# Patient Record
Sex: Male | Born: 1955 | Race: White | Hispanic: No | Marital: Single
Health system: Southern US, Community
[De-identification: ages and names within clinical notes are randomized; demographics above are authoritative.]

---

## 2011-10-29 ENCOUNTER — Emergency Department: Payer: Self-pay | Admitting: Emergency Medicine

## 2011-10-29 LAB — COMPREHENSIVE METABOLIC PANEL
Anion Gap: 9 (ref 7–16)
BUN: 5 mg/dL — ABNORMAL LOW (ref 7–18)
Calcium, Total: 8.6 mg/dL (ref 8.5–10.1)
Chloride: 96 mmol/L — ABNORMAL LOW (ref 98–107)
Co2: 26 mmol/L (ref 21–32)
Creatinine: 0.88 mg/dL (ref 0.60–1.30)
EGFR (African American): >60
EGFR (Non-African Amer.): >60
Osmolality: 264 (ref 275–301)
Potassium: 3.5 mmol/L (ref 3.5–5.1)
SGOT(AST): 23 U/L (ref 15–37)
SGPT (ALT): 15 U/L (ref 12–78)
Total Protein: 7.6 g/dL (ref 6.4–8.2)

## 2011-10-29 LAB — URINALYSIS, COMPLETE
Bacteria: NONE SEEN
Glucose,UR: 150 mg/dL (ref 0–75)
Ketone: NEGATIVE
Nitrite: NEGATIVE
Protein: 100
RBC,UR: 1 /HPF (ref 0–5)
Squamous Epithelial: NONE SEEN
WBC UR: 1 /HPF (ref 0–5)

## 2011-10-29 LAB — CK TOTAL AND CKMB (NOT AT ARMC): CK-MB: 3 ng/mL (ref 0.5–3.6)

## 2011-10-29 LAB — PROTIME-INR
INR: 0.9
Prothrombin Time: 12.9 secs (ref 11.5–14.7)

## 2011-10-29 LAB — PHOSPHORUS: Phosphorus: 2.4 mg/dL — ABNORMAL LOW (ref 2.5–4.9)

## 2011-10-29 LAB — CBC
HCT: 36.1 % — ABNORMAL LOW (ref 40.0–52.0)
HGB: 12.6 g/dL — ABNORMAL LOW (ref 13.0–18.0)
MCH: 30.4 pg (ref 26.0–34.0)
MCHC: 35 g/dL (ref 32.0–36.0)
MCV: 87 fL (ref 80–100)
RDW: 15.3 % — ABNORMAL HIGH (ref 11.5–14.5)

## 2011-10-29 LAB — MAGNESIUM: Magnesium: 1.1 mg/dL — ABNORMAL LOW

## 2012-01-12 ENCOUNTER — Emergency Department: Payer: Self-pay | Admitting: Internal Medicine

## 2012-01-12 ENCOUNTER — Emergency Department: Payer: Self-pay | Admitting: Emergency Medicine

## 2012-01-12 LAB — CBC
HCT: 36.1 % — ABNORMAL LOW (ref 40.0–52.0)
HGB: 12.2 g/dL — ABNORMAL LOW (ref 13.0–18.0)
MCH: 28.8 pg (ref 26.0–34.0)
Platelet: 259 10*3/uL (ref 150–440)
RDW: 14.7 % — ABNORMAL HIGH (ref 11.5–14.5)
WBC: 10.8 10*3/uL — ABNORMAL HIGH (ref 3.8–10.6)

## 2012-01-12 LAB — COMPREHENSIVE METABOLIC PANEL
Alkaline Phosphatase: 92 U/L (ref 50–136)
BUN: 9 mg/dL (ref 7–18)
Bilirubin,Total: 0.7 mg/dL (ref 0.2–1.0)
Calcium, Total: 8.8 mg/dL (ref 8.5–10.1)
Chloride: 90 mmol/L — ABNORMAL LOW (ref 98–107)
Co2: 21 mmol/L (ref 21–32)
Creatinine: 1.06 mg/dL (ref 0.60–1.30)
EGFR (African American): >60
EGFR (Non-African Amer.): >60
Glucose: 228 mg/dL — ABNORMAL HIGH (ref 65–99)
SGOT(AST): 24 U/L (ref 15–37)
SGPT (ALT): 20 U/L (ref 12–78)

## 2012-01-12 LAB — TROPONIN I
Troponin-I: <0.02 ng/mL
Troponin-I: <0.02 ng/mL

## 2012-03-17 ENCOUNTER — Inpatient Hospital Stay: Payer: Self-pay | Admitting: Student

## 2012-03-17 LAB — COMPREHENSIVE METABOLIC PANEL
Albumin: 4.1 g/dL (ref 3.4–5.0)
Alkaline Phosphatase: 100 U/L (ref 50–136)
Anion Gap: 15 (ref 7–16)
Bilirubin,Total: 0.8 mg/dL (ref 0.2–1.0)
Calcium, Total: 9 mg/dL (ref 8.5–10.1)
Chloride: 84 mmol/L — ABNORMAL LOW (ref 98–107)
EGFR (African American): 44 — ABNORMAL LOW
EGFR (Non-African Amer.): 38 — ABNORMAL LOW
Glucose: 343 mg/dL — ABNORMAL HIGH (ref 65–99)
Potassium: 4.6 mmol/L (ref 3.5–5.1)
SGPT (ALT): 24 U/L (ref 12–78)
Sodium: 119 mmol/L — CL (ref 136–145)
Total Protein: 8 g/dL (ref 6.4–8.2)

## 2012-03-17 LAB — CK TOTAL AND CKMB (NOT AT ARMC): CK-MB: 2.7 ng/mL (ref 0.5–3.6)

## 2012-03-17 LAB — CBC
HGB: 13.7 g/dL (ref 13.0–18.0)
MCH: 28.7 pg (ref 26.0–34.0)
MCHC: 34.1 g/dL (ref 32.0–36.0)
MCV: 84 fL (ref 80–100)
RBC: 4.75 10*6/uL (ref 4.40–5.90)
WBC: 12.7 10*3/uL — ABNORMAL HIGH (ref 3.8–10.6)

## 2012-03-17 LAB — URINALYSIS, COMPLETE
Bilirubin,UR: NEGATIVE
Glucose,UR: >=500 mg/dL (ref 0–75)
Leukocyte Esterase: NEGATIVE
Ph: 5 (ref 4.5–8.0)
WBC UR: <1 /HPF (ref 0–5)

## 2012-03-17 LAB — BASIC METABOLIC PANEL
Anion Gap: 13 (ref 7–16)
BUN: 32 mg/dL — ABNORMAL HIGH (ref 7–18)
Calcium, Total: 8.8 mg/dL (ref 8.5–10.1)
EGFR (African American): 43 — ABNORMAL LOW
Glucose: 259 mg/dL — ABNORMAL HIGH (ref 65–99)
Osmolality: 267 (ref 275–301)
Sodium: 125 mmol/L — ABNORMAL LOW (ref 136–145)

## 2012-03-17 LAB — OSMOLALITY: Osmolality: 277 mOsm/kg (ref 275–295)

## 2012-03-17 LAB — TROPONIN I: Troponin-I: 0.03 ng/mL

## 2012-03-18 LAB — CBC WITH DIFFERENTIAL/PLATELET
Eosinophil #: 0.3 10*3/uL (ref 0.0–0.7)
MCH: 28.8 pg (ref 26.0–34.0)
MCHC: 34.9 g/dL (ref 32.0–36.0)
Monocyte #: 0.7 x10 3/mm (ref 0.2–1.0)
Monocyte %: 7.3 %
Neutrophil %: 58.2 %
RDW: 14.4 % (ref 11.5–14.5)
WBC: 9 10*3/uL (ref 3.8–10.6)

## 2012-03-18 LAB — BASIC METABOLIC PANEL
Calcium, Total: 8.4 mg/dL — ABNORMAL LOW (ref 8.5–10.1)
Chloride: 94 mmol/L — ABNORMAL LOW (ref 98–107)
Creatinine: 1.41 mg/dL — ABNORMAL HIGH (ref 0.60–1.30)
EGFR (Non-African Amer.): 56 — ABNORMAL LOW
Glucose: 196 mg/dL — ABNORMAL HIGH (ref 65–99)
Osmolality: 266 (ref 275–301)
Potassium: 3.6 mmol/L (ref 3.5–5.1)

## 2012-03-18 LAB — LIPID PANEL
Ldl Cholesterol, Calc: 53 mg/dL (ref 0–100)
Triglycerides: 218 mg/dL — ABNORMAL HIGH (ref 0–200)
VLDL Cholesterol, Calc: 44 mg/dL — ABNORMAL HIGH (ref 5–40)

## 2012-03-18 LAB — MAGNESIUM: Magnesium: 2.1 mg/dL

## 2012-03-19 LAB — BASIC METABOLIC PANEL
BUN: 24 mg/dL — ABNORMAL HIGH (ref 7–18)
Creatinine: 1.48 mg/dL — ABNORMAL HIGH (ref 0.60–1.30)
EGFR (African American): >60
EGFR (Non-African Amer.): 53 — ABNORMAL LOW
Glucose: 307 mg/dL — ABNORMAL HIGH (ref 65–99)
Osmolality: 282 (ref 275–301)
Potassium: 4.1 mmol/L (ref 3.5–5.1)
Sodium: 133 mmol/L — ABNORMAL LOW (ref 136–145)

## 2012-03-19 LAB — HEMOGLOBIN A1C

## 2012-03-20 LAB — BASIC METABOLIC PANEL
Anion Gap: 7 (ref 7–16)
BUN: 19 mg/dL — ABNORMAL HIGH (ref 7–18)
Calcium, Total: 8.9 mg/dL (ref 8.5–10.1)
EGFR (African American): >60
EGFR (Non-African Amer.): 53 — ABNORMAL LOW
Glucose: 355 mg/dL — ABNORMAL HIGH (ref 65–99)
Osmolality: 285 (ref 275–301)

## 2012-03-21 LAB — BASIC METABOLIC PANEL
Anion Gap: 7 (ref 7–16)
Calcium, Total: 9 mg/dL (ref 8.5–10.1)
Co2: 26 mmol/L (ref 21–32)
EGFR (African American): 57 — ABNORMAL LOW
Osmolality: 270 (ref 275–301)
Potassium: 4.6 mmol/L (ref 3.5–5.1)
Sodium: 130 mmol/L — ABNORMAL LOW (ref 136–145)

## 2012-03-22 ENCOUNTER — Emergency Department: Payer: Self-pay | Admitting: Emergency Medicine

## 2012-03-22 LAB — CBC
MCH: 28.7 pg (ref 26.0–34.0)
MCV: 87 fL (ref 80–100)
RBC: 3.97 10*6/uL — ABNORMAL LOW (ref 4.40–5.90)
RDW: 14.6 % — ABNORMAL HIGH (ref 11.5–14.5)

## 2012-03-22 LAB — URINALYSIS, COMPLETE
Blood: NEGATIVE
Glucose,UR: >=500 mg/dL (ref 0–75)
Ketone: NEGATIVE
Leukocyte Esterase: NEGATIVE
Nitrite: NEGATIVE
Protein: NEGATIVE
RBC,UR: <1 /HPF (ref 0–5)
WBC UR: <1 /HPF (ref 0–5)

## 2012-03-22 LAB — COMPREHENSIVE METABOLIC PANEL
BUN: 18 mg/dL (ref 7–18)
Chloride: 103 mmol/L (ref 98–107)
Co2: 26 mmol/L (ref 21–32)
Creatinine: 1.55 mg/dL — ABNORMAL HIGH (ref 0.60–1.30)
EGFR (African American): 58 — ABNORMAL LOW
Glucose: 199 mg/dL — ABNORMAL HIGH (ref 65–99)
Potassium: 5 mmol/L (ref 3.5–5.1)
SGOT(AST): 26 U/L (ref 15–37)
Total Protein: 6.6 g/dL (ref 6.4–8.2)

## 2012-09-22 ENCOUNTER — Inpatient Hospital Stay: Payer: Self-pay | Admitting: Family Medicine

## 2012-09-22 LAB — URINALYSIS, COMPLETE
Bacteria: NONE SEEN
Blood: NEGATIVE
Glucose,UR: >=500 mg/dL (ref 0–75)
Leukocyte Esterase: NEGATIVE
Nitrite: NEGATIVE
Protein: NEGATIVE
RBC,UR: <1 /HPF (ref 0–5)
Specific Gravity: 1.011 (ref 1.003–1.030)
Squamous Epithelial: NONE SEEN
WBC UR: NONE SEEN /HPF (ref 0–5)

## 2012-09-22 LAB — TROPONIN I
Troponin-I: 2.2 ng/mL — ABNORMAL HIGH
Troponin-I: 3.2 ng/mL — ABNORMAL HIGH

## 2012-09-22 LAB — CK TOTAL AND CKMB (NOT AT ARMC)
CK, Total: 84 U/L (ref 35–232)
CK-MB: 5.8 ng/mL — ABNORMAL HIGH (ref 0.5–3.6)

## 2012-09-22 LAB — PROTIME-INR
INR: 0.9
Prothrombin Time: 12.8 secs (ref 11.5–14.7)

## 2012-09-22 LAB — APTT
Activated PTT: 34.7 secs (ref 23.6–35.9)
Activated PTT: 106.6 secs — ABNORMAL HIGH (ref 23.6–35.9)

## 2012-09-22 LAB — CBC
MCH: 29.1 pg (ref 26.0–34.0)
MCHC: 33.9 g/dL (ref 32.0–36.0)
Platelet: 228 10*3/uL (ref 150–440)
RBC: 3.97 10*6/uL — ABNORMAL LOW (ref 4.40–5.90)

## 2012-09-22 LAB — BASIC METABOLIC PANEL
Chloride: 102 mmol/L (ref 98–107)
Co2: 24 mmol/L (ref 21–32)
Creatinine: 1.27 mg/dL (ref 0.60–1.30)
EGFR (Non-African Amer.): >60
Glucose: 364 mg/dL — ABNORMAL HIGH (ref 65–99)

## 2012-09-22 LAB — PRO B NATRIURETIC PEPTIDE: B-Type Natriuretic Peptide: 5846 pg/mL — ABNORMAL HIGH (ref 0–125)

## 2012-09-23 LAB — APTT
Activated PTT: 117 secs — ABNORMAL HIGH (ref 23.6–35.9)
Activated PTT: 111 secs — ABNORMAL HIGH (ref 23.6–35.9)
Activated PTT: 75.7 secs — ABNORMAL HIGH (ref 23.6–35.9)

## 2012-09-23 LAB — CBC WITH DIFFERENTIAL/PLATELET
Eosinophil #: 0.4 10*3/uL (ref 0.0–0.7)
Lymphocyte %: 27.9 %
MCH: 29 pg (ref 26.0–34.0)
Monocyte #: 0.8 x10 3/mm (ref 0.2–1.0)
Neutrophil #: 5.8 10*3/uL (ref 1.4–6.5)
Platelet: 212 10*3/uL (ref 150–440)
RBC: 3.63 10*6/uL — ABNORMAL LOW (ref 4.40–5.90)

## 2012-09-23 LAB — LIPID PANEL
Triglycerides: 138 mg/dL (ref 0–200)
VLDL Cholesterol, Calc: 28 mg/dL (ref 5–40)

## 2012-09-23 LAB — COMPREHENSIVE METABOLIC PANEL
Anion Gap: 7 (ref 7–16)
Bilirubin,Total: 0.5 mg/dL (ref 0.2–1.0)
Calcium, Total: 8.3 mg/dL — ABNORMAL LOW (ref 8.5–10.1)
Chloride: 105 mmol/L (ref 98–107)
Co2: 26 mmol/L (ref 21–32)
Creatinine: 1.41 mg/dL — ABNORMAL HIGH (ref 0.60–1.30)
EGFR (African American): >60
EGFR (Non-African Amer.): 56 — ABNORMAL LOW
Glucose: 207 mg/dL — ABNORMAL HIGH (ref 65–99)
SGPT (ALT): 15 U/L (ref 12–78)
Sodium: 138 mmol/L (ref 136–145)
Total Protein: 6.3 g/dL — ABNORMAL LOW (ref 6.4–8.2)

## 2012-09-23 LAB — CK TOTAL AND CKMB (NOT AT ARMC)
CK, Total: 76 U/L (ref 35–232)
CK-MB: 3.9 ng/mL — ABNORMAL HIGH (ref 0.5–3.6)

## 2012-09-23 LAB — TROPONIN I: Troponin-I: 4 ng/mL — ABNORMAL HIGH

## 2012-09-23 LAB — TSH: Thyroid Stimulating Horm: 1.44 u[IU]/mL

## 2012-09-23 LAB — HEMOGLOBIN A1C: Hemoglobin A1C: 11.7 % — ABNORMAL HIGH (ref 4.2–6.3)

## 2012-09-23 LAB — MAGNESIUM: Magnesium: 1.5 mg/dL — ABNORMAL LOW

## 2012-09-25 LAB — PLATELET COUNT: Platelet: 219 10*3/uL (ref 150–440)

## 2012-09-25 LAB — HEMOGLOBIN: HGB: 10 g/dL — ABNORMAL LOW (ref 13.0–18.0)

## 2012-09-26 ENCOUNTER — Inpatient Hospital Stay: Payer: Self-pay | Admitting: Internal Medicine

## 2012-09-26 LAB — PROTIME-INR
INR: 1
Prothrombin Time: 13.5 secs (ref 11.5–14.7)

## 2012-09-26 LAB — COMPREHENSIVE METABOLIC PANEL
Albumin: 3.3 g/dL — ABNORMAL LOW (ref 3.4–5.0)
Alkaline Phosphatase: 108 U/L (ref 50–136)
BUN: 16 mg/dL (ref 7–18)
Bilirubin,Total: 0.6 mg/dL (ref 0.2–1.0)
Co2: 26 mmol/L (ref 21–32)
EGFR (African American): 54 — ABNORMAL LOW
Glucose: 246 mg/dL — ABNORMAL HIGH (ref 65–99)
Osmolality: 259 (ref 275–301)
Potassium: 4.7 mmol/L (ref 3.5–5.1)
SGOT(AST): 24 U/L (ref 15–37)
SGPT (ALT): 15 U/L (ref 12–78)
Sodium: 124 mmol/L — ABNORMAL LOW (ref 136–145)

## 2012-09-26 LAB — CBC
HCT: 32.2 % — ABNORMAL LOW (ref 40.0–52.0)
HGB: 10.4 g/dL — ABNORMAL LOW (ref 13.0–18.0)
MCH: 28.3 pg (ref 26.0–34.0)
MCHC: 32.5 g/dL (ref 32.0–36.0)
MCV: 87 fL (ref 80–100)
Platelet: 299 10*3/uL (ref 150–440)
RBC: 3.69 10*6/uL — ABNORMAL LOW (ref 4.40–5.90)
RDW: 14.4 % (ref 11.5–14.5)
WBC: 20.3 10*3/uL — ABNORMAL HIGH (ref 3.8–10.6)

## 2012-09-26 LAB — PRO B NATRIURETIC PEPTIDE: B-Type Natriuretic Peptide: 9635 pg/mL — ABNORMAL HIGH (ref 0–125)

## 2012-09-26 LAB — APTT: Activated PTT: 41.7 secs — ABNORMAL HIGH (ref 23.6–35.9)

## 2012-09-26 LAB — CK TOTAL AND CKMB (NOT AT ARMC)
CK, Total: 171 U/L (ref 35–232)
CK-MB: 5.2 ng/mL — ABNORMAL HIGH (ref 0.5–3.6)

## 2012-09-27 LAB — URINALYSIS, COMPLETE
Blood: NEGATIVE
Ketone: NEGATIVE
Leukocyte Esterase: NEGATIVE
Ph: 7 (ref 4.5–8.0)
Protein: NEGATIVE
Specific Gravity: 1.008 (ref 1.003–1.030)
Squamous Epithelial: NONE SEEN

## 2012-09-27 LAB — CBC WITH DIFFERENTIAL/PLATELET
Basophil %: 0.6 %
Eosinophil #: 0 10*3/uL (ref 0.0–0.7)
HCT: 29.2 % — ABNORMAL LOW (ref 40.0–52.0)
HGB: 9.7 g/dL — ABNORMAL LOW (ref 13.0–18.0)
Lymphocyte #: 0.9 10*3/uL — ABNORMAL LOW (ref 1.0–3.6)
Lymphocyte %: 7 %
MCH: 28.8 pg (ref 26.0–34.0)
MCV: 86 fL (ref 80–100)
Monocyte %: 1 %
Neutrophil #: 11.3 10*3/uL — ABNORMAL HIGH (ref 1.4–6.5)
Neutrophil %: 91.3 %
Platelet: 230 10*3/uL (ref 150–440)
RDW: 14.2 % (ref 11.5–14.5)
WBC: 12.4 10*3/uL — ABNORMAL HIGH (ref 3.8–10.6)

## 2012-09-27 LAB — BASIC METABOLIC PANEL
Anion Gap: 8 (ref 7–16)
Calcium, Total: 8.8 mg/dL (ref 8.5–10.1)
Chloride: 92 mmol/L — ABNORMAL LOW (ref 98–107)
Co2: 24 mmol/L (ref 21–32)
EGFR (African American): 51 — ABNORMAL LOW
EGFR (Non-African Amer.): 44 — ABNORMAL LOW
Glucose: 319 mg/dL — ABNORMAL HIGH (ref 65–99)
Osmolality: 265 (ref 275–301)
Potassium: 4.8 mmol/L (ref 3.5–5.1)
Sodium: 124 mmol/L — ABNORMAL LOW (ref 136–145)

## 2012-09-27 LAB — CK TOTAL AND CKMB (NOT AT ARMC)
CK, Total: 121 U/L (ref 35–232)
CK, Total: 111 U/L (ref 35–232)
CK-MB: 4.5 ng/mL — ABNORMAL HIGH (ref 0.5–3.6)

## 2012-09-27 LAB — TROPONIN I: Troponin-I: 1.11 ng/mL — ABNORMAL HIGH

## 2012-09-27 LAB — APTT: Activated PTT: >160 secs (ref 23.6–35.9)

## 2012-10-01 LAB — CULTURE, BLOOD (SINGLE)

## 2013-01-17 ENCOUNTER — Emergency Department: Payer: Self-pay | Admitting: Emergency Medicine

## 2013-03-15 ENCOUNTER — Ambulatory Visit: Payer: Self-pay | Admitting: Family Medicine

## 2013-05-09 ENCOUNTER — Emergency Department: Payer: Self-pay | Admitting: Emergency Medicine

## 2013-05-09 LAB — COMPREHENSIVE METABOLIC PANEL
ALBUMIN: 2.9 g/dL — AB (ref 3.4–5.0)
AST: 24 U/L (ref 15–37)
Alkaline Phosphatase: 121 U/L — ABNORMAL HIGH
Anion Gap: 8 (ref 7–16)
BUN: 9 mg/dL (ref 7–18)
Bilirubin,Total: 0.8 mg/dL (ref 0.2–1.0)
CREATININE: 1.08 mg/dL (ref 0.60–1.30)
Calcium, Total: 8.6 mg/dL (ref 8.5–10.1)
Chloride: 99 mmol/L (ref 98–107)
Co2: 26 mmol/L (ref 21–32)
GLUCOSE: 140 mg/dL — AB (ref 65–99)
OSMOLALITY: 267 (ref 275–301)
POTASSIUM: 3.7 mmol/L (ref 3.5–5.1)
SGPT (ALT): 16 U/L (ref 12–78)
SODIUM: 133 mmol/L — AB (ref 136–145)
Total Protein: 6.7 g/dL (ref 6.4–8.2)

## 2013-05-09 LAB — CBC
HCT: 31.8 % — ABNORMAL LOW (ref 40.0–52.0)
HGB: 10.4 g/dL — ABNORMAL LOW (ref 13.0–18.0)
MCH: 28 pg (ref 26.0–34.0)
MCHC: 32.6 g/dL (ref 32.0–36.0)
MCV: 86 fL (ref 80–100)
Platelet: 154 10*3/uL (ref 150–440)
RBC: 3.7 10*6/uL — AB (ref 4.40–5.90)
RDW: 16.8 % — ABNORMAL HIGH (ref 11.5–14.5)
WBC: 10.4 10*3/uL (ref 3.8–10.6)

## 2013-05-09 LAB — TSH: Thyroid Stimulating Horm: 1.41 u[IU]/mL

## 2013-05-09 LAB — CK TOTAL AND CKMB (NOT AT ARMC)
CK, TOTAL: 50 U/L
CK-MB: 1.2 ng/mL (ref 0.5–3.6)

## 2013-05-09 LAB — MAGNESIUM: Magnesium: 1.7 mg/dL — ABNORMAL LOW

## 2013-05-09 LAB — PRO B NATRIURETIC PEPTIDE: B-Type Natriuretic Peptide: 6099 pg/mL — ABNORMAL HIGH (ref 0–125)

## 2013-05-09 LAB — PROTIME-INR
INR: 1.1
Prothrombin Time: 13.8 secs (ref 11.5–14.7)

## 2013-05-09 LAB — PHOSPHORUS: PHOSPHORUS: 3.2 mg/dL (ref 2.5–4.9)

## 2013-05-09 LAB — TROPONIN I: Troponin-I: <0.02 ng/mL

## 2013-05-09 LAB — DIGOXIN LEVEL

## 2013-05-16 ENCOUNTER — Emergency Department: Payer: Self-pay | Admitting: Emergency Medicine

## 2013-05-26 ENCOUNTER — Emergency Department: Payer: Self-pay | Admitting: Emergency Medicine

## 2013-05-26 LAB — COMPREHENSIVE METABOLIC PANEL
Albumin: 3.2 g/dL — ABNORMAL LOW (ref 3.4–5.0)
Alkaline Phosphatase: 122 U/L — ABNORMAL HIGH
Anion Gap: 6 — ABNORMAL LOW (ref 7–16)
BUN: 10 mg/dL (ref 7–18)
Bilirubin,Total: 0.3 mg/dL (ref 0.2–1.0)
CALCIUM: 8.6 mg/dL (ref 8.5–10.1)
CHLORIDE: 84 mmol/L — AB (ref 98–107)
CREATININE: 1.47 mg/dL — AB (ref 0.60–1.30)
Co2: 26 mmol/L (ref 21–32)
EGFR (African American): >60
EGFR (Non-African Amer.): 53 — ABNORMAL LOW
Glucose: 197 mg/dL — ABNORMAL HIGH (ref 65–99)
Osmolality: 239 (ref 275–301)
POTASSIUM: 3.8 mmol/L (ref 3.5–5.1)
SGOT(AST): 45 U/L — ABNORMAL HIGH (ref 15–37)
SGPT (ALT): 33 U/L (ref 12–78)
Sodium: 116 mmol/L — CL (ref 136–145)
Total Protein: 6.7 g/dL (ref 6.4–8.2)

## 2013-05-26 LAB — CBC WITH DIFFERENTIAL/PLATELET
BASOS PCT: 0.7 %
Basophil #: 0 10*3/uL (ref 0.0–0.1)
EOS PCT: 4.6 %
Eosinophil #: 0.3 10*3/uL (ref 0.0–0.7)
HCT: 28.2 % — ABNORMAL LOW (ref 40.0–52.0)
HGB: 9.5 g/dL — ABNORMAL LOW (ref 13.0–18.0)
LYMPHS ABS: 1.5 10*3/uL (ref 1.0–3.6)
Lymphocyte %: 21.2 %
MCH: 28.5 pg (ref 26.0–34.0)
MCHC: 33.8 g/dL (ref 32.0–36.0)
MCV: 84 fL (ref 80–100)
MONO ABS: 0.5 x10 3/mm (ref 0.2–1.0)
Monocyte %: 7.2 %
NEUTROS ABS: 4.8 10*3/uL (ref 1.4–6.5)
NEUTROS PCT: 66.3 %
Platelet: 247 10*3/uL (ref 150–440)
RBC: 3.35 10*6/uL — AB (ref 4.40–5.90)
RDW: 16.7 % — ABNORMAL HIGH (ref 11.5–14.5)
WBC: 7.2 10*3/uL (ref 3.8–10.6)

## 2013-05-26 LAB — MAGNESIUM: Magnesium: 1.5 mg/dL — ABNORMAL LOW

## 2013-05-26 LAB — TROPONIN I: TROPONIN-I: 0.02 ng/mL

## 2013-05-26 LAB — DIGOXIN LEVEL: Digoxin: <0.1 ng/mL — ABNORMAL LOW

## 2013-06-16 ENCOUNTER — Emergency Department: Payer: Self-pay | Admitting: Emergency Medicine

## 2013-06-16 LAB — BASIC METABOLIC PANEL
Anion Gap: 6 — ABNORMAL LOW (ref 7–16)
BUN: 34 mg/dL — ABNORMAL HIGH (ref 7–18)
CHLORIDE: 100 mmol/L (ref 98–107)
Calcium, Total: 8.7 mg/dL (ref 8.5–10.1)
Co2: 28 mmol/L (ref 21–32)
Creatinine: 1.68 mg/dL — ABNORMAL HIGH (ref 0.60–1.30)
EGFR (African American): 52 — ABNORMAL LOW
EGFR (Non-African Amer.): 45 — ABNORMAL LOW
Glucose: 142 mg/dL — ABNORMAL HIGH (ref 65–99)
Osmolality: 278 (ref 275–301)
POTASSIUM: 3.9 mmol/L (ref 3.5–5.1)
SODIUM: 134 mmol/L — AB (ref 136–145)

## 2013-06-16 LAB — CBC WITH DIFFERENTIAL/PLATELET
BASOS ABS: 0.2 10*3/uL — AB (ref 0.0–0.1)
BASOS PCT: 2.7 %
EOS PCT: 9.7 %
Eosinophil #: 0.8 10*3/uL — ABNORMAL HIGH (ref 0.0–0.7)
HCT: 32.8 % — ABNORMAL LOW (ref 40.0–52.0)
HGB: 10.5 g/dL — ABNORMAL LOW (ref 13.0–18.0)
LYMPHS ABS: 1.9 10*3/uL (ref 1.0–3.6)
Lymphocyte %: 24.1 %
MCH: 28.4 pg (ref 26.0–34.0)
MCHC: 32 g/dL (ref 32.0–36.0)
MCV: 89 fL (ref 80–100)
Monocyte #: 0.5 x10 3/mm (ref 0.2–1.0)
Monocyte %: 6.9 %
NEUTROS PCT: 56.6 %
Neutrophil #: 4.4 10*3/uL (ref 1.4–6.5)
PLATELETS: 239 10*3/uL (ref 150–440)
RBC: 3.7 10*6/uL — AB (ref 4.40–5.90)
RDW: 18.7 % — ABNORMAL HIGH (ref 11.5–14.5)
WBC: 7.8 10*3/uL (ref 3.8–10.6)

## 2013-06-16 LAB — PRO B NATRIURETIC PEPTIDE: B-Type Natriuretic Peptide: 963 pg/mL — ABNORMAL HIGH (ref 0–125)

## 2013-06-16 LAB — TROPONIN I: Troponin-I: 0.03 ng/mL

## 2013-08-18 ENCOUNTER — Emergency Department: Payer: Self-pay | Admitting: Emergency Medicine

## 2013-08-18 LAB — URINALYSIS, COMPLETE
BILIRUBIN, UR: NEGATIVE
Bacteria: NONE SEEN
Blood: NEGATIVE
GLUCOSE, UR: NEGATIVE mg/dL (ref 0–75)
Ketone: NEGATIVE
Leukocyte Esterase: NEGATIVE
Nitrite: NEGATIVE
Ph: 6 (ref 4.5–8.0)
Protein: NEGATIVE
RBC,UR: <1 /HPF (ref 0–5)
SPECIFIC GRAVITY: 1.01 (ref 1.003–1.030)
Squamous Epithelial: <1
WBC UR: NONE SEEN /HPF (ref 0–5)

## 2013-08-18 LAB — COMPREHENSIVE METABOLIC PANEL
ALBUMIN: 2.9 g/dL — AB (ref 3.4–5.0)
ALK PHOS: 133 U/L — AB
ANION GAP: 7 (ref 7–16)
AST: 58 U/L — AB (ref 15–37)
BUN: 18 mg/dL (ref 7–18)
Bilirubin,Total: 0.4 mg/dL (ref 0.2–1.0)
CALCIUM: 8.3 mg/dL — AB (ref 8.5–10.1)
Chloride: 97 mmol/L — ABNORMAL LOW (ref 98–107)
Co2: 26 mmol/L (ref 21–32)
Creatinine: 1.49 mg/dL — ABNORMAL HIGH (ref 0.60–1.30)
EGFR (African American): 60 — ABNORMAL LOW
EGFR (Non-African Amer.): 52 — ABNORMAL LOW
Glucose: 174 mg/dL — ABNORMAL HIGH (ref 65–99)
OSMOLALITY: 267 (ref 275–301)
POTASSIUM: 4.5 mmol/L (ref 3.5–5.1)
SGPT (ALT): 44 U/L
SODIUM: 130 mmol/L — AB (ref 136–145)
TOTAL PROTEIN: 7.4 g/dL (ref 6.4–8.2)

## 2013-08-18 LAB — CBC WITH DIFFERENTIAL/PLATELET
BASOS ABS: 0.1 10*3/uL (ref 0.0–0.1)
Basophil %: 0.6 %
EOS ABS: 0.4 10*3/uL (ref 0.0–0.7)
Eosinophil %: 3.4 %
HCT: 36.4 % — ABNORMAL LOW (ref 40.0–52.0)
HGB: 11.6 g/dL — ABNORMAL LOW (ref 13.0–18.0)
LYMPHS PCT: 13.5 %
Lymphocyte #: 1.8 10*3/uL (ref 1.0–3.6)
MCH: 28.7 pg (ref 26.0–34.0)
MCHC: 31.8 g/dL — AB (ref 32.0–36.0)
MCV: 90 fL (ref 80–100)
Monocyte #: 1.1 x10 3/mm — ABNORMAL HIGH (ref 0.2–1.0)
Monocyte %: 8.5 %
Neutrophil #: 9.7 10*3/uL — ABNORMAL HIGH (ref 1.4–6.5)
Neutrophil %: 74 %
PLATELETS: 264 10*3/uL (ref 150–440)
RBC: 4.04 10*6/uL — AB (ref 4.40–5.90)
RDW: 16.5 % — ABNORMAL HIGH (ref 11.5–14.5)
WBC: 13.1 10*3/uL — ABNORMAL HIGH (ref 3.8–10.6)

## 2013-08-18 LAB — DIGOXIN LEVEL: Digoxin: 0.2 ng/mL

## 2013-11-21 DEATH — deceased

## 2014-05-13 NOTE — Discharge Summary (Signed)
PATIENT NAME:  Joseph Floyd, REGER MR#:  [id number] DATE OF BIRTH:  05-24-1955  DATE OF ADMISSION:  09/26/2012 DATE OF DISCHARGE:     PRIMARY CARE PHYSICIAN: Burley Saver, MD    FINAL DIAGNOSES: 1.  Acute systolic congestive heart failure.  2.  Acute respiratory failure.  3.  Recent myocardial infarction.  4.  Relative hypotension.  5.  Diabetes.  6.  Hyperlipidemia.  7.  Chronic obstructive pulmonary disease.  8.  Urinary retention.   CURRENT MEDICATIONS: Include Tylenol 650 mg orally q.4 hours p.r.n. pain or temperature, aspirin 325 mg daily, atorvastatin 80 mg at bedtime, Coreg 3.125 mg b.i.d., digoxin 0.125 mg daily, Colace 100 mg b.i.d. p.r.n. constipation, fenofibrate 48 mcg daily, Advair Diskus 250/50 one inhalation twice a day, NovoLog sliding scale, lorazepam 0.5 mg IV push q.6 hours p.r.n. anxiety, Nicotrol inhaler 1e cartridge every 30 minutes p.r.n. nicotine craving (patient refused nicotine patch), nitroglycerin 0.4 mg sublingually q.5 minutes p.r.n. chest pain, Zofran 4 mg IV push q.4 hours p.r.n. nausea/vomiting, Protonix 40 mg daily, senna 1 tablet twice a day p.r.n. constipation, TUMS chewable 1000 mg q.i.d. p.r.n. indigestion, Proscar 5 mg p.o. daily, heparin subcutaneous 5000 units subcutaneous injection q.12 hours, Lantus 25 units subcutaneous injection in the morning, tramadol 50 mg q.6 hours p.r.n. pain, Combivent 1 puff q.i.d., Lasix 20 mg b.i.d., lisinopril 2.5 mg at bedtime, BiPAP at night, albuterol 2.5 mg nebulizer q.2 hours p.r.n. shortness of breath.   REASON FOR ADMISSION: The patient was readmitted 09/26/2012, discharged September 8th or 9th over to [facility]. The patient came back with acute onset shortness of breath.   HISTORY OF PRESENT ILLNESS AND HOSPITAL COURSE: A 59 year old man with recent myocardial infarction had severe 2-vessel coronary artery disease on recent cardiac cath, also found to have a cardiomyopathy with an EF of 20%. He did smoke a few cigarettes  and all of a sudden developed acute shortness of breath requiring BiPAP initially and pulse oximetry dropping down to 70%. He was admitted initially to the Critical Care Unit and started on Lasix, initially put on antibiotic and steroid for possible COPD exacerbation.   Laboratory and radiological data during the hospital course included a glucose of 146. INR 1.0. Troponin 1.06. Glucose 246, BUN 16, creatinine 1.63, sodium 124, potassium 4.7, chloride 91, CO2 of 26, calcium 9.4. Liver function tests: Normal range. White blood cell count 20.3, hemoglobin and hematocrit 10.4 and 32.3, platelet count 299. BNP 9635. Chest x-ray: Enlargement of the cardiac silhouette, diffuse interstitial edema consistent with congestive heart failure. ABG showed a pH of 7.39, pCO2 of 39, pO2 of 204, bicarbonate 23.6, O2 saturation 98.6. Blood cultures negative. Urine culture negative. Urinalysis negative. Next troponin 1.11. Next troponin 0.78. Stress test on September 8 showed dilated cardiomyopathy, EF of 22%. Stress and rest images suggest moderate area of  inferoapical defect which is fixed with a moderate area of reversible anteroseptal ischemia. Suggest clinical correlation.   Since the patient was treated with medical management for myocardial infarction and went into acute abrupt CHF, it was believed that ischemia is causing this. Medical management failed. The patient will be transferred over to [facility] for further evaluation.   1.  For the patient's acute respiratory failure, the patient is now breathing comfortably off oxygen. Lasix was switched over to oral. For the patient's acute systolic congestive heart failure, echocardiogram last time did show an EF of 40% to 45%, but EF on the cardiac cath was 20% and stress test 22%.  2.  The patient does have a cardiomyopathy. Blood pressure is limiting the patient's further titration of medications. The patient is on low-dose Coreg, lisinopril and Lasix.   3.  For the  recent myocardial infarction. I do not believe that this is a new myocardial infarction since the troponin is trending down. The cath showed severe 2-vessel disease. The patient is on aspirin, Coreg p.r.n., nitroglycerin and statin, and the [facility].   4.  For the relative hypotension, the patient's blood pressure has been 100/61 range during the  entire hospitalization.   5.  For his diabetes, I restarted Lantus. Since there were steroids that were started on this hospitalization, sugars have been high. I stopped the steroids.   6.  Hyperlipidemia: The patient is on max dose of Lipitor and fenofibrate. Results of the lipid profile were done on last hospitalization which showed an LDL of 164, HDL 50, triglycerides 138, total cholesterol 242.   7.  For the patient's COPD, lungs are now clear. I did stop the steroids and kept his inhalers going. I stopped the antibiotics on admission.   8.  Urinary retention on presentation: The patient had a Foley placed. I did remove the Foley per patient's request. I did start Proscar and urecholine. Urecholine was stopped since the patient is urinating.   9.  Chronic kidney disease: Need to watch creatinine on a daily basis.   10.  Hyponatremia: The patient is on a fluid reconstruction. Sodium still on the lower side.   TIME SPENT ON PATIENT CARE TODAY: One hour.   ____________________________ Herschell Dimesichard J. Renae GlossWieting, MD rjw:jm D: 09/28/2012 15:30:35 ET T: 09/28/2012 16:44:49 ET JOB#: [id number]  cc: Herschell Dimesichard J. Renae GlossWieting, MD, <Dictator> Burley SaverL. Katherine Bliss, MD [facility] [address] Vanderz[address]  Salley ScarletICHARD J Rajanae Mantia MD ELECTRONICALLY SIGNED 10/02/2012 16:14

## 2014-05-13 NOTE — H&P (Signed)
PATIENT NAME:  Joseph Floyd, Joseph Floyd MR#:  [id number] DATE OF BIRTH:  01/10/1956  DATE OF ADMISSION:  09/26/2012  REFERRING PHYSICIAN:  Dr. Lowella Fairy  PRIMARY CARE PHYSICIAN: Dr. Quillian Quince   The patient has been seen one time during last admission by Dr. Darrold Junker, cardiology.   CHIEF COMPLAINT: Shortness of breath and chest pain.   HISTORY OF PRESENT ILLNESS: This is a 59 year old male with significant past medical history of coronary artery disease, was discharged this afternoon from [facility] after the causes of non-ST-elevated MI. The patient had cardiac catheter by Dr. Darrold Junker with a diagnosis of severe two vessel coronary artery disease complex, high grade stenosis and proximal LAD and occluded RCA, which was reviewed with the [facility] and agreement was done to medical therapy best option at this point secondary to his severe cardiomyopathy. The patient was discharged today with no chest pain. At home, the patient did had did smoke a few cigarettes, up to four as per family, where he reported severe shortness of breath, chest pain. The patient was hypoxic at presentation to ED, saturating 70% on oxygen, where he required BiPAP.  The patient's chest x-ray did show evidence of bilateral pulmonary edema, and his BNP significantly elevated from around 5000 last admission to more than 9000 this time. As well, he had significant JVD, even though had no significant lower extremity edema.  The patient was given IV Lasix with improvement of his shortness of breath. As well, he had significant wheezing upon presentation, improved with Decadron and nebulizer treatment. The patient was found to have elevated troponin at 1.06. The patient had an elevated level of discharge of his troponin more than around 4, which is just lower than the baseline. The patient's EKG still showed the same ST elevation changes upon admission during his last hospitalization on September the 2nd. This was discussed with the cardiologist  on call, Dr. Gwen Pounds, who reported the patient as an emergent indication for cardiac catheter, these are most likely changes from his previous ST elevated MI, but we will continue him empirically on heparin drip for the first 24 hours. Continue his beta blockers and aspirin.  As well, the patient was noticed to be in acute renal failure with a creatinine of 1.6 which was essentially normal upon admission during last time. As well, the patient was noticed to have significant leukocytosis of 20,000, Denies any fever or chills. There is no cough, no productive sputum. No dysuria, polyuria. The patient was ordered Vanc and Zosyn empirically in the ED, and blood culture and urine culture still pending. The patient was noticed to have hyponatremia at 124, which appears to be new to him as well. The patient currently reports he is much better on BiPAP. Chest pain, currently resolved, shortness of breath, much improved. Hospitalist service was requested to admit the patient for further management and treatment of his problems.   PAST MEDICAL HISTORY:  1.  Coronary artery disease.  2.  Hyperlipidemia.  3.  Diabetes mellitus. 4.  COPD.  4.  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  5.  History of gout. 6.  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   ALLERGIES: NSAIDS GIVE HIM STOMACH IRRITATION.   PAST SURGICAL HISTORY:  1.  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   2.  Positive surgery for neck for DJD, ankle fusion, shoulder surgery,  for rotator cuff and foot surgery for osteoarthritis on the great toe.   FAMILY HISTORY: Significant for cancer in the family and sister has history of coronary artery disease.  SOCIAL HISTORY: The patient smokes 1 pack per day. Does not drink or use drugs, disabled, lives with his sister.   HOME MEDICATIONS: The patient was discharged on these  medications this afternoon. Oxybutynin 5 mg 2 times a day, glipizide 5 mg daily, Combivent 2 puffs 4 times a day as needed, gabapentin 300 mg 3 capsules 3 times a day, trazodone 150 mg 1 to 2 tablets at bedtime as needed, aspart insulin 5 units subcutaneous 3 times a day with meals, atorvastatin 80 mg at bedtime, fenofibrate 48 mg at bedtime, allopurinol 100 mg oral 2 times a day, lorazepam 0.5 mg at bedtime, lisinopril 2.5 mg daily, Cymbalta 60 mg daily, Januvia 50 mg daily, Ultram 50 mg every 6 hours as needed for pain sublingual nitroglycerin as needed, digoxin 125 mcg daily, Lantus 30 units subcutaneous at night, aspirin 325 mg daily, Coreg 3.125 mg twice a day, Advair 250/50, 1 inhalation twice a day, magnesium oxide 5 mg oral every 8 hours.   REVIEW OF SYSTEMS: CONSTITUTIONAL: Denies fever or chills. Complains of generalized weakness, fatigue. Denies weight gain, weight loss.  EYES: Denies blurry vision, double vision, inflammation, glaucoma.  ENT: Denies tinnitus, ear pain, hearing loss, epistaxis or discharge.  RESPIRATORY: Denies any cough, painful respiratory. He complains of wheezing, shortness of breath. Denies any hemoptysis or productive sputum.  CARDIOVASCULAR: Denies edema, syncope. Had earlier complaints of chest pain and diaphoresis, currently resolved.  GASTROINTESTINAL: Denies nausea, vomiting, diarrhea, abdominal pain, hematemesis, melena, jaundice.  GENITOURINARY: Denies dysuria, hematuria, renal colic.  ENDOCRINE: Denies polyuria, polydipsia, heat or cold intolerance.  HEMATOLOGY: Denies anemia, easy bruising, bleeding diathesis.  INTEGUMENT: Denies acne, rash or skin lesions.  MUSCULOSKELETAL: Denies any cramps, swelling. Has history of gout and arthritis and chronic neck pain  NEUROLOGICAL:  Denies CVA, TIA, ataxia, headache, tremors.  PSYCHIATRIC: Denies insomnia, depression, substance or alcohol abuse. Reports anxiety.   PHYSICAL EXAMINATION: VITAL SIGNS: Pulse 71,  respiratory rate 16, blood pressure 98/54, satting 100% on BiPAP.  GENERAL: Obese male, in moderate respiratory distress.  HEENT: Head atraumatic normocephalic. Pupils equal, reactive to light. Pink conjunctivae. Anicteric sclerae. Moist oral mucosa. Wearing facial mask BiPAP.  NECK: Supple. No thyromegaly. Has JVD + 6 to 8 cm. No thyromegaly. Trachea is midline.  CHEST: Good air entry bilaterally with scattered, rales and bibasilar crackles, mild wheezing.  CARDIOVASCULAR: S1, S2 heard. No rubs or gallops. Regular rhythm.  ABDOMEN: Obese, soft, nontender, nondistended. Bowel sounds present.  EXTREMITIES: No edema. No clubbing. No cyanosis or dorsalis pedis pulse felt bilaterally.   PSYCHIATRIC: Appropriate affect. Awake, alert x 3. Intact judgment and insight.  NEUROLOGIC: Grossly intact. Motor 5 out of 5. No focal deficits. Sensation symmetrical and intact to light touch, no headache, no cervical or supraclavicular lymphadenopathy.   PERTINENT LABS: Glucose 246, BNP 9,635, BUN 16, creatinine 1.63, sodium 124, potassium 4.7, chloride 91, CO2 26,  Troponin I 0.06, CK-MB 5.2. White count is 20,000.3, hemoglobin 10.4, hematocrit 32.2, platelets 299. ABG showing pH of 7.39, pCO2 of 39, pO2 of 204 on BiPAP.   EKG showing ST elevation in the anterior leads, but once compared to old EKG in September 2nd, there are no changes.   ASSESSMENT AND PLAN: 1.  Acute hypoxic respiratory failure. The patient was saturating on oxygen 70% upon presentation, this appears  to be multifactorial most likely due to his acute systolic congestive heart failure, as well chronic obstructive pulmonary disease exacerbation. We will keep on BiPAP and will start treatment for his systolic congestive heart failure and chronic obstructive pulmonary disease. 2.  Acute systolic congestive heart failure.  Last ejection fraction was 20% on the patient. Currently, he is on BiPAP with much improvement, has pulmonary edema on his chest  x-ray, ejection fraction 20%. We will keep diuresed as tolerated given his soft blood pressure. We will keep him on 40 of Lasix IV every 12 hours with some blood pressure holding parameters and will continue him on digoxin beta blockers. We will hold his lisinopril due to his worsening renal function and due to his soft blood pressure.  3.  Chronic obstructive pulmonary disease exacerbation. We will have the patient on Solu-Medrol and on nebulizer treatment. Will continue him on Symbicort as well and will keep him on BiPAP.  4.  Elevated troponin with ST elevation on EKG. This was discussed with Dr. Gwen Pounds. This is most likely related to his recent ST elevated myocardial infarction, we will have him on heparin drip for the first 20 hours. We will keep him on beta blockers as blood pressure allows and aspirin. At this point, there is no indication for emergency cardiac catheter.  5. Acute renal failure. We will hold the patient's lisinopril will monitor closely as he is on aggressive diuresis.  6.  Leukocytosis. At this point etiology is unclear.  Unclear if this is stress related or steroid related, but we will start the patient empirically on vancomycin and Zosyn. We will check urinalysis, we will check blood cultures.  7.  Hyponatremia. This is most likely secondary to his volume overload and congestive heart failure. We will monitor as the patient is being diuresed.  8.  Diabetes mellitus: Will hold oral agents. We will have him on insulin sliding scale.  9.  Hyperlipidemia. We will continue with fenofibrate and statins.  10.  Tobacco abuse: The patient was counseled.  He will be started on NicoDerm patch.  11.  Deep vein thrombosis prophylaxis. The patient is full dose anticoagulation.  12.  Gastrointestinal prophylaxis. The patient is on proton pump inhibitor.  13.  CODE STATUS: The patient is full code.   Total time spent on admission and patient care of critical care time:  65 minutes.     ____________________________ Starleen Arms, MD dse:nts D: 09/26/2012 22:49:16 ET T: 09/26/2012 23:27:12 ET JOB#: [id number]  cc: Starleen Arms, MD, <Dictator> Isidra Mings Teena Irani MD ELECTRONICALLY SIGNED 10/02/2012 3:06

## 2014-05-13 NOTE — H&P (Signed)
PATIENT NAME:  Joseph Floyd, Joseph Floyd MR#:  [id number] DATE OF BIRTH:  1955-12-06  REFERRING PHYSICIAN: Dr. Margarita Grizzle.   PRIMARY CARE PHYSICIAN: Nonlocal.   OTHER CONSULTANTS: Dr. Marcina Millard,  cardiology.   CHIEF COMPLAINT: Chest pain for several days.   HISTORY OF PRESENT ILLNESS: This is a 59 year old gentleman who has history of coronary artery disease.  1.  He has been recently living with his sister who is helping to take care of him. He has also hyperlipidemia, COPD. He has history of replacement of mitral valve which is done with a human cadaver donor.  2.  He had two heart attacks and he is a current smoker.  3.  The patient has been doing well up until last week. He started to have significant shortness of breath that has been increasing day by day.  4.  He says that the shortness of breath has been going on for over two weeks and it was worse on Saturday. On Saturday he developed chest pain which was really severe. He is a very poor historian. He cannot give me much information. He said the chest pain was at some point 10 out of 10, and it happened only whenever he was ambulating. The pain went away or relieved completely whenever he laid down at rest.  5.  This morning it happened the same. He had chest pain with ambulation and went away whenever he rested.  6.  The pain intensity was up to 10 out of 10. The patient walked from the parking lot to the hospital and he had to stop at least 3 times on the way because he was getting so short of breath. 7.  The patient states that with the chest pain, he occasionally will have sweating, diaphoresis, and some nausea.  8.  At this moment, the patient is relaxing. His pain is 0 out of 10.  9.  Again, the pain was worse on Saturday. He was evaluated by Dr. Darrold Junker. Please refer to his consultation, whenever he talked to him. He states that the heart attack is no longer acute, that it happened likely on Saturday since he already has Q waves.  10.   The patient is admitted for medical management. Again, the patient is a very, very poor historian, and he is not able to give me any more details of the pain. The sister is there trying to help, and she actually is getting into a discussion with the patient and we are not able to get a good evaluation of this.  11.  Apparently on a chronic basis, the patient cannot walk more than 4 feet without getting short of breath for the past 2 weeks. He had a chest x-ray which did show mild pulmonary edema for which Lasix was given.   REVIEW OF SYSTEMS: CONSTITUTIONAL: Positive fatigue, weakness. Negative fever. Negative weight gain. Actually, the patient has some mild weight loss in the past 6 months, a couple pounds.  EYES: No double vision, blurry vision. No glaucoma.  ENT: No tinnitus. No difficulty swallowing. No postnasal drip.  RESPIRATORY: No cough at this moment, but the patient has occasional morning cough due to his COPD. No current wheezing. No hemoptysis. No peripheral respirations. The patient is a smoker of 1 pack a day. Smoking cessation education was given to the patient and the family for over 4 minutes. The patient states that he is not willing to use any tobacco replacement. At this moment, he did does not seem convinced to quit  at this moment.  CARDIOVASCULAR: Positive chest pain. Positive orthopnea. Positive occasional edema. Negative arrhythmia. Negative palpitations. No syncope.  GASTROINTESTINAL: At this moment, no nausea, vomiting, abdominal pain, constipation, melena or red blood per rectum.  GENITOURINARY: No dysuria, hematuria, changes in frequency. No prostatitis.  ENDOCRINE: No polyuria, polydipsia, polyphagia, cold or heat intolerance.  HEMOLYMPHATIC: No anemia, easy bruising or swollen glands.  SKIN: No rashes, petechiae, sores, or new lesions.  MUSCULOSKELETAL: Positive back pain chronically, positive joint pain chronically. Positive history of gout in the past.  NEUROLOGIC: No  numbness, tingling ataxia, CVAs or TIAs.  PSYCHIATRIC: No significant depression or agitation.   PAST MEDICAL HISTORY:  1.  Coronary artery disease.  2.  Hyperlipidemia.  3.  Diabetes.  4.  COPD.  5.  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  6.  History of gout.  7.  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   ALLERGIES: NSAIDS GIVE HIM STOMACH IRRITATION.   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   FAMILY HISTORY:  1.  Positive for cancer. Multiple members of the family have cancer. Brain on his mother.  Brother had stomach cancer. Other brother had lung cancer.  4.  His sister had a heart attack.   SOCIAL HISTORY: The patient smokes 1 pack per day. He does not drink alcohol or use drugs. He is disabled. He lives with his sister and her fiance.   CURRENT MEDICATIONS:  Include: 1.  Ultram 50 mg every 4 hours.  2.  Lisinopril 2.5 mg every day.  3.  Gabapentin 300 mg 3 times daily.  4.  Lorazepam 0.5 mg at bedtime.  5.  Cymbalta 60 mg once a day.  6.  Trazodone 150 mg, take one to 2 at bedtime for sleep.  7.  Glipizide 5 mg once a day.  8.  Insulin aspart 5 units three times a day with meals.  9.  Insulin Lantus 24 units subcutaneously every day.  10.  Sitagliptin 50 mg once a day.  11.  Allopurinol 100 mg two times a day.  12.  Lipitor 80 mg once a day.  13.  Fenofibrate 40 mg at bedtime.  14.  Combivent as needed for shortness of breath.  15.  Ventolin as needed for shortness of breath.  16.  Magnesium 400 mg once a day.  17.  Nasonex 50 mcg two sprays once daily.  18.  Oxybutynin 5 mg two times a day.   PHYSICAL EXAMINATION:  VITAL SIGNS: Blood pressure 116/63, pulse 85, respirations 24. Temperature is  97.5.  GENERAL: Alert and oriented x3. Very poor historian. In no acute distress. No respiratory distress. Hemodynamically he is stable.  HEENT: Pupils are equal and reactive. Extraocular movements are intact. Mucosa are moist. Anicteric sclerae. Pink conjunctivae. No oral lesions. No oropharyngeal exudates.  NECK: Supple. No JVD. No thyromegaly. No adenopathy. No carotid bruits. No rigidity.  CARDIOVASCULAR: Regular rate and rhythm. No murmurs, rubs or gallops are appreciated at this moment. No displacement of PMI. No tenderness to palpation of anterior chest wall.  LUNGS: Positive crackles in both bases, diffuse. No use of accessory muscles. Good air expansion with air entrance on both lungs.  ABDOMEN: Soft, nontender, nondistended. No hepatosplenomegaly.  No masses. Bowel sounds are positive.  GENITAL: Negative for external lesions.  EXTREMITIES: No edema, cyanosis or clubbing. Pulses +2. Capillary refill less than 3.  SKIN: No rashes, petechiae or obvious wounds.  LYMPHATIC: Negative for lymphadenopathy in neck or supraclavicular areas.  NEUROLOGIC: Cranial nerves II through XII intact. Strength is five out of five in all four extremities. DTRs +2.  PSYCHIATRIC: Negative for agitation. The patient has good judgment.  GENERAL: Alert, oriented x3.  MUSCULOSKELETAL: No significant joint abnormalities. No joint swelling or erythema.   LABORATORY, DIAGNOSTIC AND RADIOLOGICAL DATA: Glucose is 364. BNP is 5800. Creatinine is 1.27. Sodium 133. Potassium is 3.8. Troponin is 2.2 with a CK-MB of 5.8 and a total CK of 87. White count is 9.6. Hemoglobin is 11.6. Platelet count is 228. INR is 0.9.   EKG: Positive ST elevation in septal areas with Q waves.   CHEST X-RAY: Mild CHF with prominent vascular congestion.   ASSESSMENT AND PLAN: A 59 year old gentleman with history of diabetes, hypertension, hyperlipidemia, coronary artery disease, disabled from back and neck problems. The patient comes with chest  pain that has been going on for 3 to 4 days. The worst pain was Saturday. The patient comes with ST elevation myocardial infarction. This happened at least 2 to 3 days ago. The patient is hemodynamically stable, admitted for medical management as cardiology recommendation.   1.  Chest pain/ST elevation myocardial infarction. Again, the myocardial infarction happened likely on Saturday or Sunday as the patient already has Q waves.  2.  His troponin is stable. We are going to follow up troponins. Dr. Darrold Junker saw the patient already and he is not planning for an intervention at this moment;  maybe in the near future.  3.  The patient is going to be admitted to telemetry as he is stable with a heparin drip, aspirin, beta blocker, ACE inhibitor, nitroglycerin, Integrilin drip. I would like to give Plavix to this patient, load him up with 300 mg and then continue 75 a day, but I would like to have input from cardiology before that. Dr. Darrold Junker has been paged.  4.  Other than that, the patient is looking okay. He has mild pulmonary edema for what we are going to treat this also with Lasix.  5.  Congestive heart failure. The patient with pulmonary edema unknown, ejection fraction. We are going to check an echocardiogram, put him on Lasix, continue beta blocker and ACE inhibitor.  6.  The patient seems to be stable and not on any acute distress at this moment.  7.  Hyperlipidemia. Lipitor 80 mg once daily, higher dose. Consider changing to Crestor or other statin if his lipid profile is really bad. We are going to check lipid profile.  8.  Diabetes. The patient has uncontrolled diabetes. We are going to continue Lantus and the insulin sliding scale for now. Change dose if necessary.  9.  Hypertension. Continue treatment with current medications.  beta blocker. Hold in case of hypotension.  10.  Chronic obstructive pulmonary disease. Continue same inhalers the patient is taking at home. The patient is a smoker.  Smoking cessation education given to the patient for 4 to 5 minutes. The patient is not really willing to quit smoking.  11.  Disabled from back problems. Continue pain medication support.  12.  Gastrointestinal prophylaxis with proton pump inhibitor.  13.  Deep vein thrombosis prophylaxis. The patient on a heparin drip.   TIME SPENT: About 60 minutes with this patient.  CODE STATUS:  FULL CODE.    ____________________________ Felipa Furnace, MD rsg:np D: 09/22/2012 14:15:49 ET T: 09/22/2012 15:10:17 ET JOB#: [id number]  cc: Felipa Furnace, MD, <Dictator> Markelle Asaro Juanda Chance MD ELECTRONICALLY SIGNED 10/02/2012 3:38

## 2014-05-13 NOTE — Discharge Summary (Signed)
PATIENT NAME:  Joseph Floyd, Joseph Floyd MR#:  [id number] DATE OF BIRTH:  November 06, 1955  DATE OF ADMISSION:  03/17/2012 DATE OF DISCHARGE: 03/21/2012   PRIMARY CARE PHYSICIAN: Dr. Quillian Quince  CHIEF COMPLAINT: Nausea, vomiting, dizziness.   DISCHARGE DIAGNOSES: 1.  Nausea, vomiting likely viral gastroenteritis.  2.  Dehydration.  3.  Hyponatremia from dehydration and nausea, vomiting.  4.  Acute on likely chronic kidney disease.  5.  Chronic systolic congestive heart failure with ejection fraction of about 30% to 35%.  6.  Poorly controlled diabetes.  7.  Chronic obstructive pulmonary disease.  8.  Hypertension.  9.  Tobacco abuse.  10.  Hyperlipidemia.  11.  History of valve replacement.   DISCHARGE MEDICATIONS: 1.  Oxybutynin 5 mg 1 tab 2 times a day. 2.  Glipizide 5 mg daily. 3.  Combivent 18 mcg/103 mcg inhaled 2 puffs 4 times a day as needed for shortness of breath. 4.  Gabapentin 300 mg three caps 3 times a day. 5.  Magnesium oxide 400 mg 1 tab daily. 6.  Nasonex 50 mcg daily 2 sprays in each nostril. 7.  Trazodone 150 mg 1 to 2 tabs once a day at bedtime as needed for sleep. 8.  Ventolin HFA 90 mcg inhaled 1 to 2 puffs 4 times a day as needed for shortness of breath. 9.  Acetaminophen/oxycodone 325/7.5 mg 1 tab 3 times a day as needed for pain. 10.  Lantus 15 units at bedtime. 11.  Insulin aspart 5 units subcutaneous 3 times a day with meals. 12. Atorvastatin 80 mg daily. 13.  Fenofibrate 48 mg daily. 14.  Allopurinol 100 mg 2 times a day. 15.  Lorazepam 0.5 mg once a day at bedtime. 16.  Lisinopril 2.5 mg daily. 17.  Cymbalta 60 mg 1 tab once a day, delayed release. 18.  Sitagliptin 50 mg 1 tab once a day.   DIET:  Low sodium, low fat, low cholesterol, ADA diet.   ACTIVITY:  As tolerated.   FOLLOWUP:  Please follow with Dr. Quillian Quince on March 4th as previously scheduled.  Please follow with kidney doctor within 1 to 2 weeks.   DISPOSITION:  Home.   HISTORY OF PRESENT ILLNESS: For full  details of H and P, please see the dictation on February 25 by Dr. Dr. Jacques Navy.  But briefly, this is a 59 year old male with hyperlipidemia, COPD, valve replacement who came in with nausea, vomiting and p.o. intolerance and was noted to have acute renal failure with creatinine of 1.9 and sodium of 119. Hospitalist services were contacted for further evaluation and management.  SIGNIFICANT LABORATORIES AND IMAGING:  Initial sodium was 119, creatinine 1.93. Last sodium of 130 and potassium of 4.6 and creatinine of 1.8.  BUN last of 22. Initial BUN was 36. Peak creatinine of 1.97. Hemoglobin A1c severely elevated at 16.5. LDL 53, HDL 39, triglycerides 218. LFTs on arrival within normal limits. Troponin negative. TSH 0.732. Initial WBC 12.7, which normalized. Initial hemoglobin 13.7, platelets of 208. Urine osmolality was 386. UA not suggestive of infection. VBG showing pH of 7.35 and pCO2 of 39.  Echocardiogram showed LV ejection fraction was noted to be 30% to 35%.  septum and apex are abnormal. Mild to moderate increased LV cavity size.  Moderate mitral valve regurgitation. Mild to moderate tricuspid regurg. Moderately increased LV posterior wall thickness.  X-ray of the chest, one view, showing no acute cardiopulmonary disease.   HOSPITAL COURSE: The patient was admitted to the hospitalist service, started on IV fluids  for the hyponatremia normal and acute renal failure. The patient was also started on Zofran p.r.n. for the nausea. The symptoms of nausea, vomiting stopped and was likely secondary to viral gastritis. He was also started on a proton pump inhibitor. His kidney function did improve, however, did stay in the mid 1's indicating a possible CKD. He was instructed to follow with a nephrologist as an outpatient. His sodium improved significantly with normal saline and resolution of the nausea and vomiting. His current GFR lays him in CKD stage III territory. While hospitalized, his sugars were  grossly abnormal and hemoglobin A1c was checked which was 16.5. He stated that he has run out of his medications and he is in the transition, in attempt to get a new doctor here, and had run out of most of his medications. Here he was started on aspart 5 units t.i.d. with meals and Lantus of 15 units, in addition to Januvia and glipizide and sugars are better. His blood pressure remained stable. An echocardiogram was obtained showing EF of 30% to 35%. The patient likely has chronic systolic CHF and at this point, he is not in volume overload state. He has a history of GI bleeds due to gastric ulcers which are stable. PPI should be continued. At this point he will be discharged with outpatient follow up with Dr. Quillian QuinceBliss, who would be seeing him for the first time later this week.  Total Time Spent:  35 minutes.  CODE STATUS:  PATIENT IS FULL CODE.   ____________________________ Krystal EatonShayiq Ellissa Ayo, MD sa:ce D: 03/21/2012 14:02:03 ET T: 03/21/2012 14:52:56 ET JOB#: [id number]  cc: Krystal EatonShayiq Kallee Nam, MD, <Dictator>  Dr. Pete GlatterBliss Elyon Zoll [facility] Center[facility]ADZIA MD ELECTRONICALLY SIGNED 03/30/2012 13:17

## 2014-05-13 NOTE — Discharge Summary (Signed)
PATIENT NAME:  Joseph Floyd, Joseph Floyd MR#:  [id number] DATE OF BIRTH:  05/28/1955  DATE OF ADMISSION:  09/22/2012 DATE OF DISCHARGE:  09/26/2012  PRIMARY CARE PHYSICIAN:  Dr. Joen Laura.  CARDIOLOGIST: Dr. Darrold Junker.   FINAL DIAGNOSES: 1.  ST segment elevation myocardial infarction with chest pain.  2.  Hypotension.  3.  Possible acute systolic congestive heart failure.  4.  Diabetes.  5.  Hyperlipidemia.  6.  Nasal congestion.  7.  Tobacco abuse.   MEDICATIONS ON DISCHARGE: Include oxybutynin 5 mg twice a day, glipizide 5 mg daily, Combivent 2 puffs 4 times a day as needed for shortness of breath, gabapentin 300 mg 3 capsules 3 times a day, trazodone 150 mg 1 to 2 tablets at bedtime as needed for sleep, aspart insulin 5 units subcutaneous injection 3 times a day with meals, atorvastatin 80 mg at bedtime, fenofibrate 48 mg at bedtime, allopurinol 100 mg twice a day, lorazepam 0.5 mg at bedtime, lisinopril 2.5 mg daily, Cymbalta 60 mg daily, Januvia 50 mg daily, Ultram 50 mg every 6 hours as needed for pain, nitroglycerin 0.4 mg sublingual tablet every 5 minutes as needed for chest pain. Digoxin 125 mcg daily, Lantus 30 units subcutaneous injection at night, aspirin 325 mg daily, Coreg 3.125 mg twice a day, Advair 250/50 one inhalation twice a day, magnesium oxide 400 mg every 8 hours.   DIET: Low sodium diet, carbohydrate-controlled diet, regular consistency.   ACTIVITY: As tolerated.   FOLLOW-UP:  With Dr. Darrold Junker this week and in 1 to 2 weeks with Dr. Joen Laura.   HOSPITAL COURSE: The patient was admitted 09/22/2012, discharged 09/26/2012. Came in with chest pain for several days. He was admitted with chest pain, ST elevation myocardial infarction, which probably happened Saturday or Sunday prior to admission since he already had Q waves. He was initially started on heparin, aspirin and beta blocker, nitroglycerin and Integrilin. He was taken to the cardiac catheter lab on 09/24/2012, that  showed severe two vessel coronary artery disease, complex high-grade stenosis of proximal LAD, occluded RCA. Discuss the case with Dr. Jacqlyn Krauss at [facility] who agreed best option would be medical management at this point. EF was 20%.   LABORATORY AND RADIOLOGICAL DATA DURING THE HOSPITAL COURSE: Included a BNP of 5846, INR 0.9, glucose 364, BUN 15, creatinine 1.87, sodium 133, potassium 3.8, chloride 102, CO2 24. White blood cell count 9.5, hemoglobin and hematocrit 11.6 and 34.2, platelet count of 228, troponin 2.2. EKG: Sinus rhythm, first-degree AV block. Chest x-ray showed mild CHF. Urinalysis negative. Echo showed EF 40% to 45%, mild mitral valve regurgitation, mildly dilated left atrium. Troponin went up to 3.2 and then up to 4. Hemoglobin A1c 11.7. TSH 1.44, magnesium also 1.5. LDL 164, HDL 50, triglycerides 138. Creatinine upon discharge 1.4.   HOSPITAL COURSE PER PROBLEM LIST:  1.  For the patient's STEMI and chest pain, the patient had a cardiac catheter and they recommended medical management. Close clinical follow-up with cardiology needed in this case. The patient is on aspirin, Coreg, statin  and p.r.n. nitroglycerin.  2.  Relative hypotension. We will watch closely on medications. The patient is able to tolerate low-dose Coreg. The patient does take low-dose lisinopril at home. Will have him restart that at home.  3.  Possible acute systolic congestive heart failure. Lungs upon discharge clear. The patient has not been given Lasix here in the hospital. I will hold off at this point on giving Lasix, just continue ACE inhibitor and Coreg.  4.  Diabetes with hemoglobin A1c very elevated. Continue Lantus and oral medications at this time. This suggests noncompliance.  5.  Hyperlipidemia. LDL very elevated on high-dose statin. We will probably be unable to reach goal on this patient.  6.  Nasal congestion on Nasonex.  7.  Tobacco abuse. Smoking cessation counseling done 3 minutes during the  hospitalization   Time Spent On Discharge: 35 minutes.   ____________________________ Herschell Dimesichard J. Renae GlossWieting, MD rjw:dp D: 09/26/2012 14:09:58 ET T: 09/26/2012 14:55:36 ET JOB#: [id number]  cc: Herschell Dimesichard J. Renae GlossWieting, MD, <Dictator> Burley SaverL. Katherine Bliss, MD Marcina MillardAlexander Paraschos, MD Salley ScarletICHARD J Camielle Sizer MD ELECTRONICALLY SIGNED 10/02/2012 16:12
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
## 2014-05-13 NOTE — Consult Note (Signed)
PATIENT NAME:  Joseph Floyd, Joseph Floyd MR#:  [id number] DATE OF BIRTH:  Aug 07, 1955  DATE OF CONSULTATION:  09/27/2012  REFERRING PHYSICIAN:  Dr. Allena Katz. CONSULTING PHYSICIAN:  Lamar Blinks, MD  REASON FOR CONSULTATION: Severe dilated cardiomyopathy with acute on chronic systolic dysfunction, congestive heart failure with myocardial infarction and coronary artery disease.   CHIEF COMPLAINT: "I'm short of breath."   HISTORY OF PRESENT ILLNESS: This is a 59 year old male with acute subendocardial myocardial infarction, non-STEMI earlier last week, who had cardiac catheterization showing critical left anterior descending artery coronary artery disease, moderate atherosclerosis of circumflex artery and an occluded with collateral flow for the right coronary artery. The patient was treated with appropriate medication management and also was treated with appropriate medication management for severe dilated cardiomyopathy with ejection fraction of 25%. The patient then went home and had an acute onset of substernal chest discomfort, shortness of breath and acute on chronic congestive heart failure. The patient then was seen in the Emergency Room with intravenous Lasix and significantly improved. He does have a troponin of 1.1. All of this is downtrending from his previous hospitalization. Hemoglobin is 9.7, consistent with some anemia exacerbating above. Therefore, we need further evaluation and possible treatment of left anterior descending artery stenosis.   REVIEW OF SYSTEMS: The remainder of review of systems negative for vision change, ringing in the ears, hearing loss, cough, congestion, heartburn, nausea, vomiting, diarrhea, bloody stools, stomach pain, extremity pain, leg weakness, cramping in the buttocks, known blood clots, headaches, blackouts, dizzy spells, nosebleeds, congestion, trouble swallowing, frequent urination, urination at night, muscle weakness, numbness, anxiety, depression, skin lesions or  skin rashes.   PAST MEDICAL HISTORY 1.  Cardiomyopathy.  2.  Hypertension.  3.  Hyperlipidemia.  4.  Coronary artery disease.   FAMILY HISTORY: No family members with early onset of cardiovascular disease or hypertension.   SOCIAL HISTORY: The patient is a smoker and denies alcohol use.   ALLERGIES: As listed.   MEDICATIONS:  As listed.  PHYSICAL EXAMINATION VITAL SIGNS: Blood pressure 102/64 bilaterally, heart rate 72 upright, reclining and regular.  GENERAL: He is a well-appearing male in no acute distress.  HEAD, EYES, EARS, NOSE AND THROAT: No icterus, thyromegaly, ulcers, hemorrhage or xanthelasma.  CARDIOVASCULAR: Regular rate and rhythm. Normal S1 and S2 with a 2 to 3 out of 6 apical murmur consistent with mitral regurgitation. PMI is anterolaterally displaced. Carotid upstroke normal without bruit. Jugular venous pressure is normal.  LUNGS: Have bibasilar crackles with slightly decreased breath sounds.  ABDOMEN: Soft, nontender, without hepatosplenomegaly or masses. Abdominal aorta is normal size without bruit.  EXTREMITIES: Bilateral pulses 2+ in dorsal, pedal, radial and femoral arteries without lower extremity edema, cyanosis, clubbing or ulcers.  NEUROLOGIC: He is oriented to time, place and person with normal mood and affect.   ASSESSMENT: A 59 year old male with coronary artery disease, acute on chronic systolic dysfunction, congestive heart failure, previous myocardial infarction, hypertension, hyperlipidemia and needing further treatment.   RECOMMENDATIONS 1.  Continue medication management for cardiomyopathy including beta blocker, ACE inhibitor and nitrates.  2.  Lexiscan infusion Myoview to assess extent of myocardial ischemia and distribution of ischemia for need in targeting intervention including bypass surgery versus the possibility of PCI and stent placement in left anterior descending artery.  3.  Continue Lasix for lower extremity edema, acute on chronic  systolic dysfunction, congestive heart failure.  4.  Further diagnostic testing and treatment options after above.   ____________________________ Lamar Blinks, MD bjk:cs D:  09/27/2012 19:55:40 ET T: 09/27/2012 20:06:09 ET JOB#: [id number]  cc: Lamar BlinksBruce J. Kowalski, MD, <Dictator> Lamar BlinksBRUCE J KOWALSKI MD ELECTRONICALLY SIGNED 09/30/2012 14:26

## 2014-05-13 NOTE — Consult Note (Signed)
PATIENT NAME:  Joseph Floyd, Joseph Floyd MR#:  [id number] DATE OF BIRTH:  [birth year]  DATE OF CONSULTATION:  09/22/2012  CONSULTING PHYSICIAN:  Joseph MillardAlexander Lusero Nordlund, MD  PRIMARY CARE PHYSICIAN: Burley SaverL. Katherine Bliss, MD  CHIEF COMPLAINT: Shortness of breath.   HISTORY OF PRESENT ILLNESS: The patient is a 59 year old gentleman with reported history of prior aortic valve replacement surgery. The patient reports he had surgery while he was [place] in [place]. The patient reports that he was in his usual state of health until 2 to 3 days prior to admission when he started to experience substernal chest discomfort. The patient reports that on 09/20/2012 and 09/21/2012, the patient experienced intermittent chest pain rated as greater than 10 out of 10. The patient did not seek medical attention. The patient reports that he has been experiencing increasing shortness of breath. Today, the patient lay down on the couch and became short of breath and then finally [facility]. Initial EKG revealed sinus rhythm with Q waves in leads V1 and V2 with a residual ST elevation. The patient reports that he has some mild chest tightness, but denies chest pain like he was experiencing yesterday or the day before.   PAST MEDICAL HISTORY: 1.  Status post valvular heart surgery.  2.  COPD.   MEDICATIONS: Please see med list.   SOCIAL HISTORY: The patient is married and resides with his wife. He continues to smoke, recently smoking up to 2 packs of cigarettes a day.   FAMILY HISTORY: No immediate family history of coronary artery disease or myocardial infarction.   REVIEW OF SYSTEMS:    CONSTITUTIONAL: No fever or chills.  EYES: No blurry vision.  EARS: No hearing loss.  RESPIRATORY: Shortness of breath as described above.  CARDIOVASCULAR: Chest pain as described above.  GASTROINTESTINAL: No nausea, vomiting or diarrhea.  GENITOURINARY: No dysuria or hematuria.  ENDOCRINE: No polyuria or polydipsia.   MUSCULOSKELETAL: No arthralgias or myalgias.  NEUROLOGICAL: No focal muscle weakness or numbness.  PSYCHOLOGICAL: No depression or anxiety.   PHYSICAL EXAMINATION: VITAL SIGNS: Blood pressure was 102/70, pulse of 70.  GENERAL: The patient is a middle-aged gentleman in mild to moderate respiratory distress.  HEENT: Pupils equal, reactive to light and accommodation.  NECK: Supple without thyromegaly.  LUNGS: Revealed crackles a third way up in both lung fields.  CARDIOVASCULAR: Normal JVP. Normal PMI. Regular rate and rhythm. Normal S1, S2. No appreciable gallop, murmur or rub.  ABDOMEN: Soft, nontender.  EXTREMITIES: Pulses were intact bilaterally. There was trace pedal edema.  MUSCULOSKELETAL: Normal muscle tone.  NEUROLOGIC: The patient is alert and oriented x 3. Motor and sensory both grossly intact.   IMPRESSION: This is a 59 year old gentleman who presents with evolving anterior myocardial infarction. The patient likely is 24 to possibly 36 hours out from onset of symptoms. The patient is currently experiencing symptoms most consistent with congestive heart failure.   RECOMMENDATIONS: 1.  Furosemide 40 mg IV. 2.  Heparin bolus and drip.  3.  Integrilin bolus and drip. 4.  Admit to CCU.  5.  Would defer taking directly to cardiac catheterization since the patient is likely 24 to 36 hours after onset of symptoms and currently is essentially chest pain-free.   ____________________________ Joseph MillardAlexander Calvin Chura, MD ap:jm D: 09/22/2012 13:53:37 ET T: 09/22/2012 15:21:51 ET JOB#: [id number]  cc: Joseph MillardAlexander Javeah Loeza, MD, <Dictator> Joseph MillardALEXANDER Chez Bulnes MD ELECTRONICALLY SIGNED 10/01/2012 7:59

## 2015-06-04 IMAGING — CR DG CHEST 1V PORT
1 series · 1 of 1 positions shown · non-contrast
Comparison: none

REASON FOR EXAM: chest pain, dyspnea
COMMENTS:

[ap]
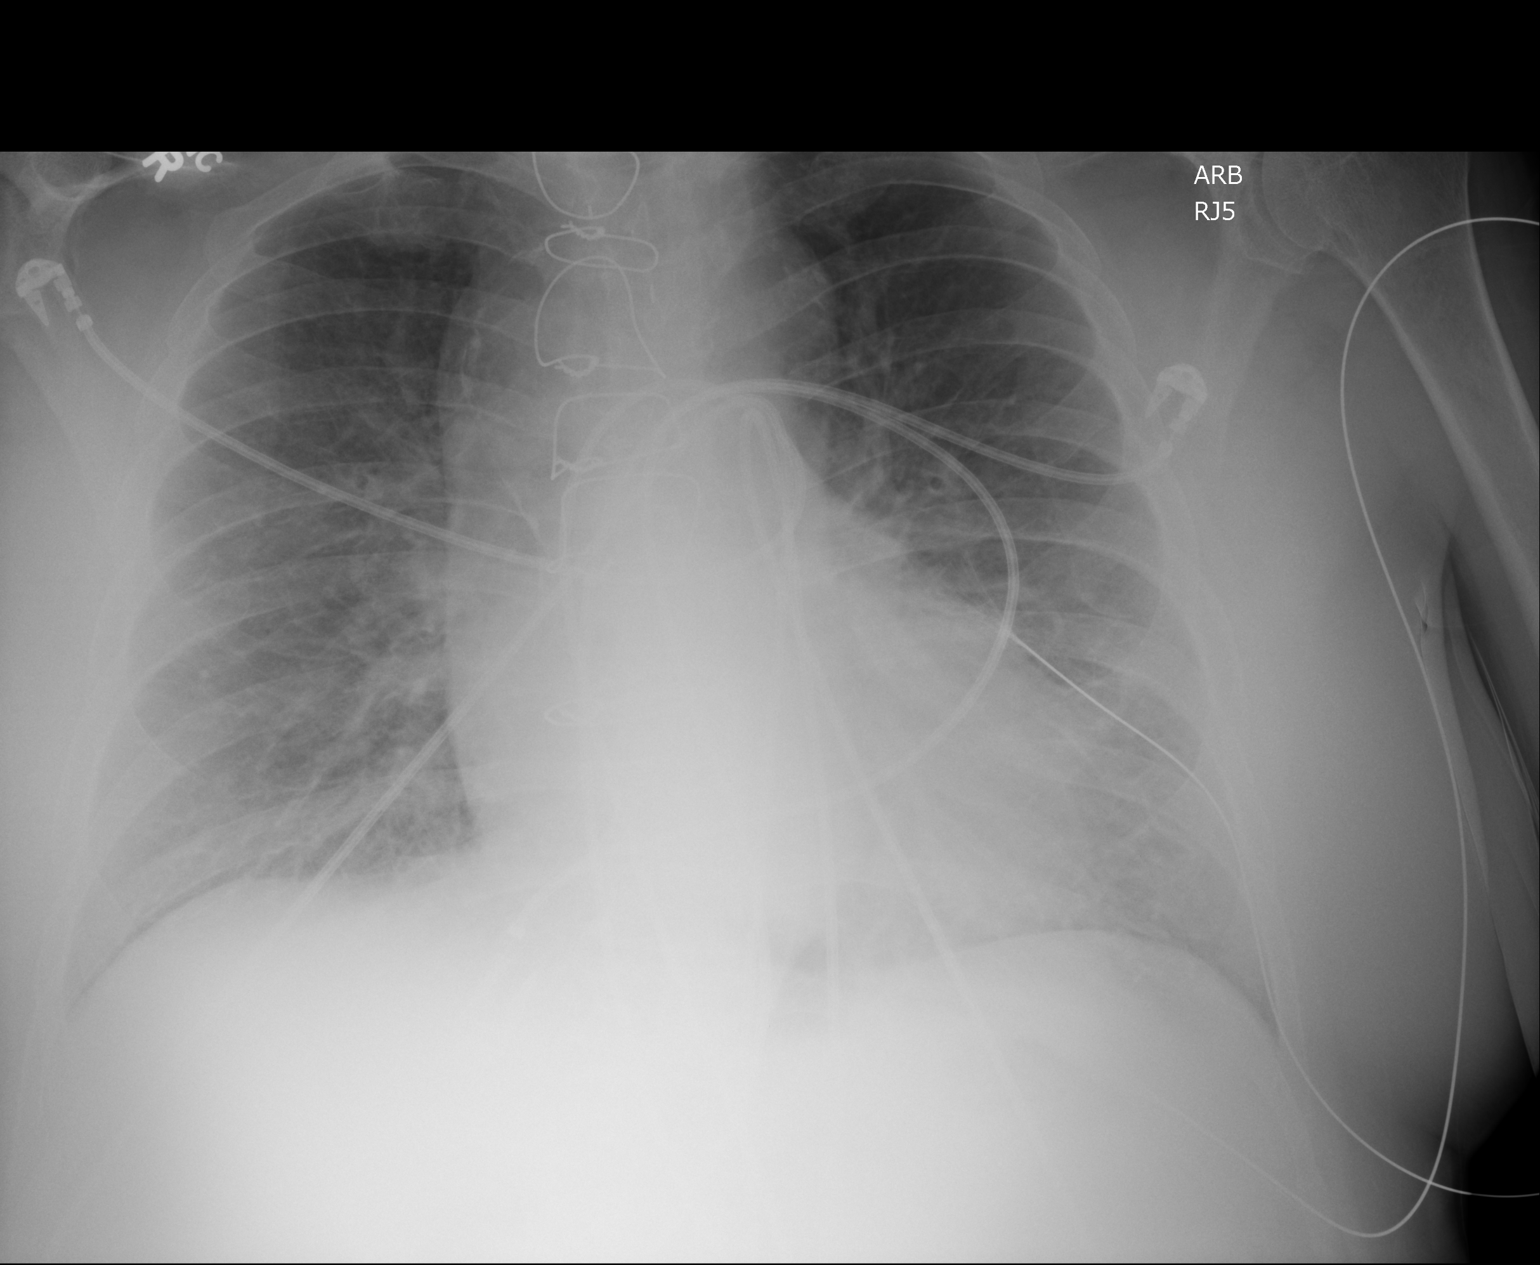

[1 of 1 positions shown; findings below may reference images not displayed]

PROCEDURE:     DXR - DXR PORTABLE CHEST SINGLE VIEW  - September 22, 2012 [DATE]

RESULT:     Comparison made to prior study 03/17/2012. Mild cardiomegaly.Mild
interstitial prominence. Mild component of congestive heart failure may be
present. Interstitial prominence is new from 03/17/2012. Prior median
sternotomy.
IMPRESSION: Mild CHF.

## 2016-02-26 IMAGING — CR DG CHEST 2V
1 series · 2 of 2 positions shown · non-contrast
Comparison: Chest radiograph performed 05/09/2013

CLINICAL DATA: Shortness of breath and dizziness.

EXAM:
CHEST  2 VIEW

[Series 1: w chest pa · 0.14mm/px · 2 of 2 slices shown]
[im 1/2]
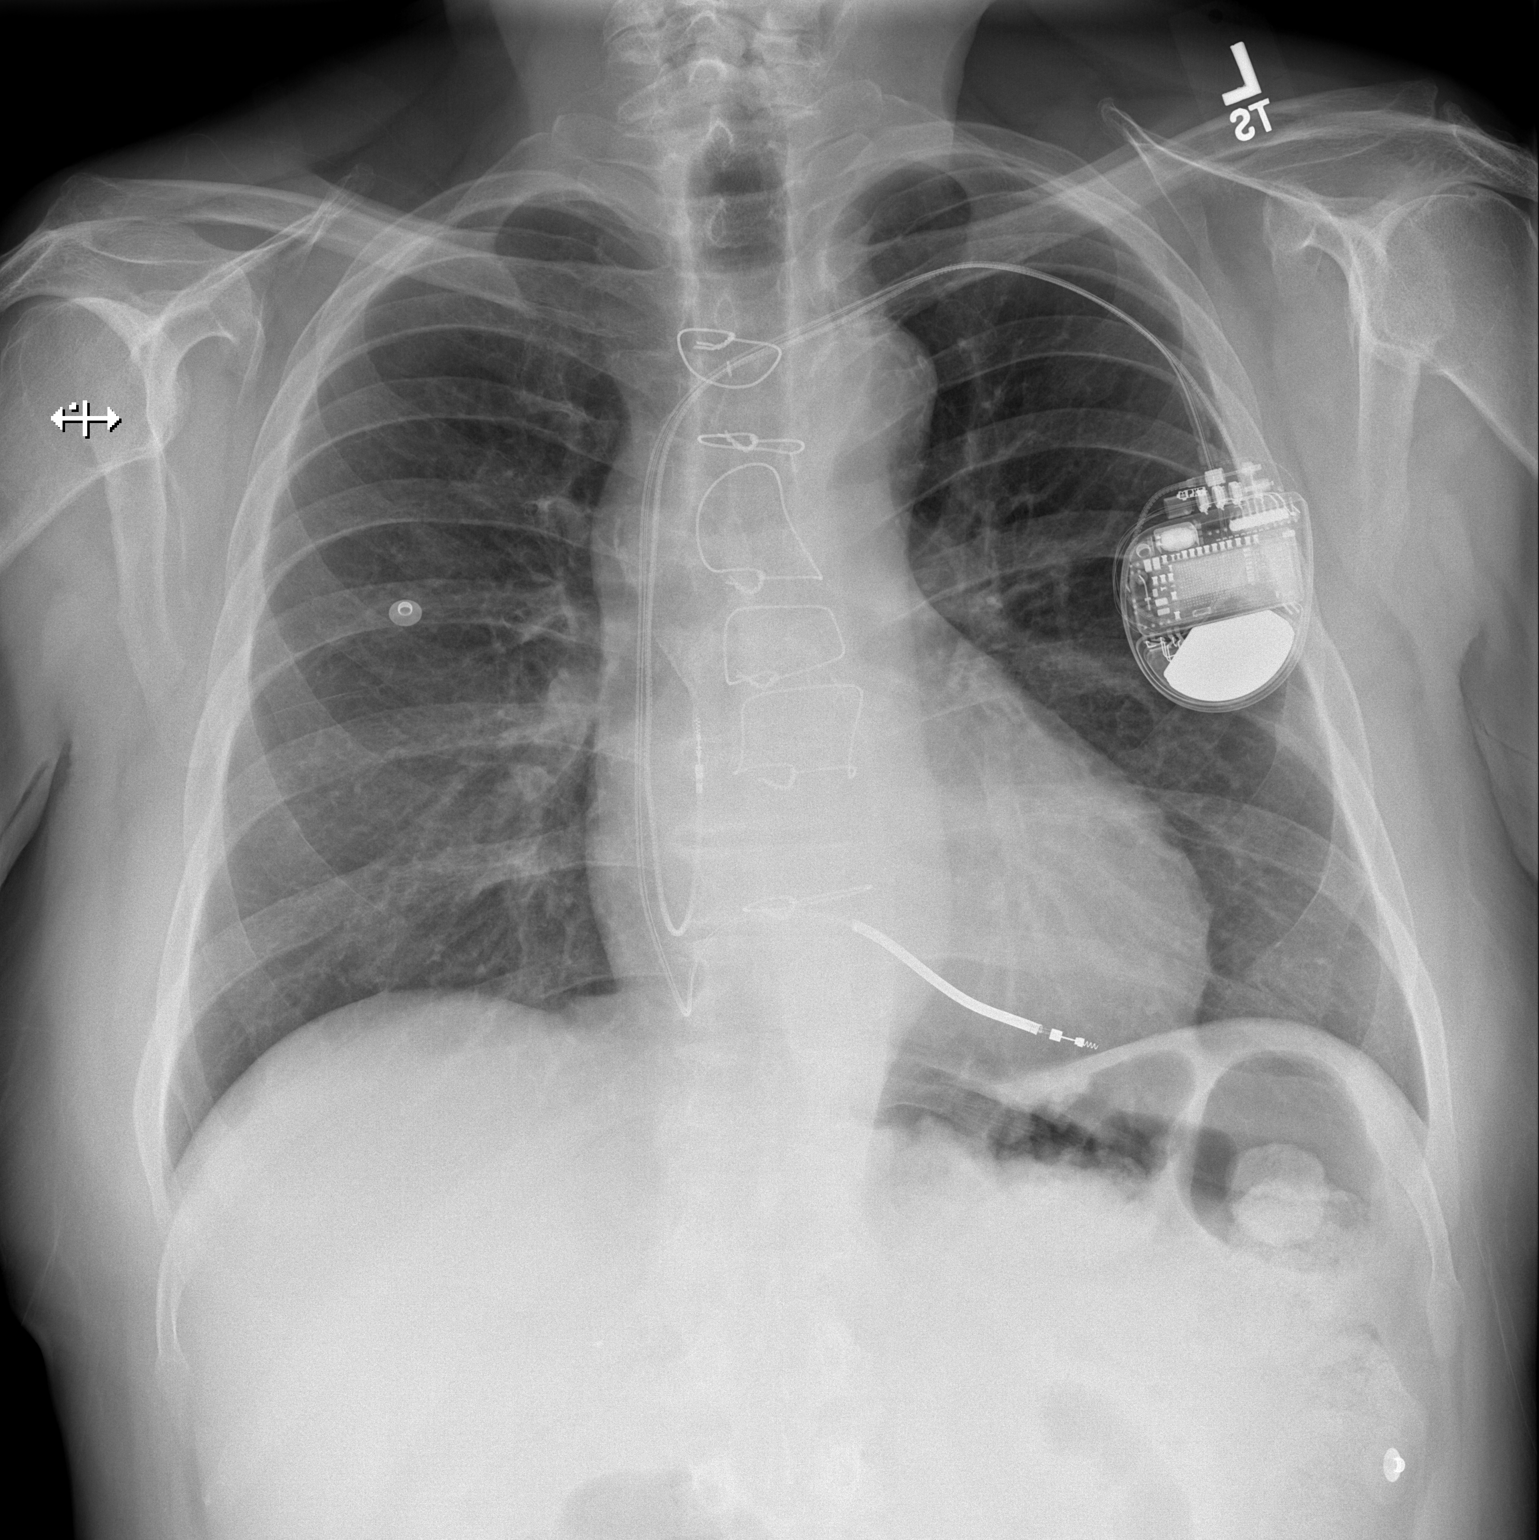
[im 2/2]
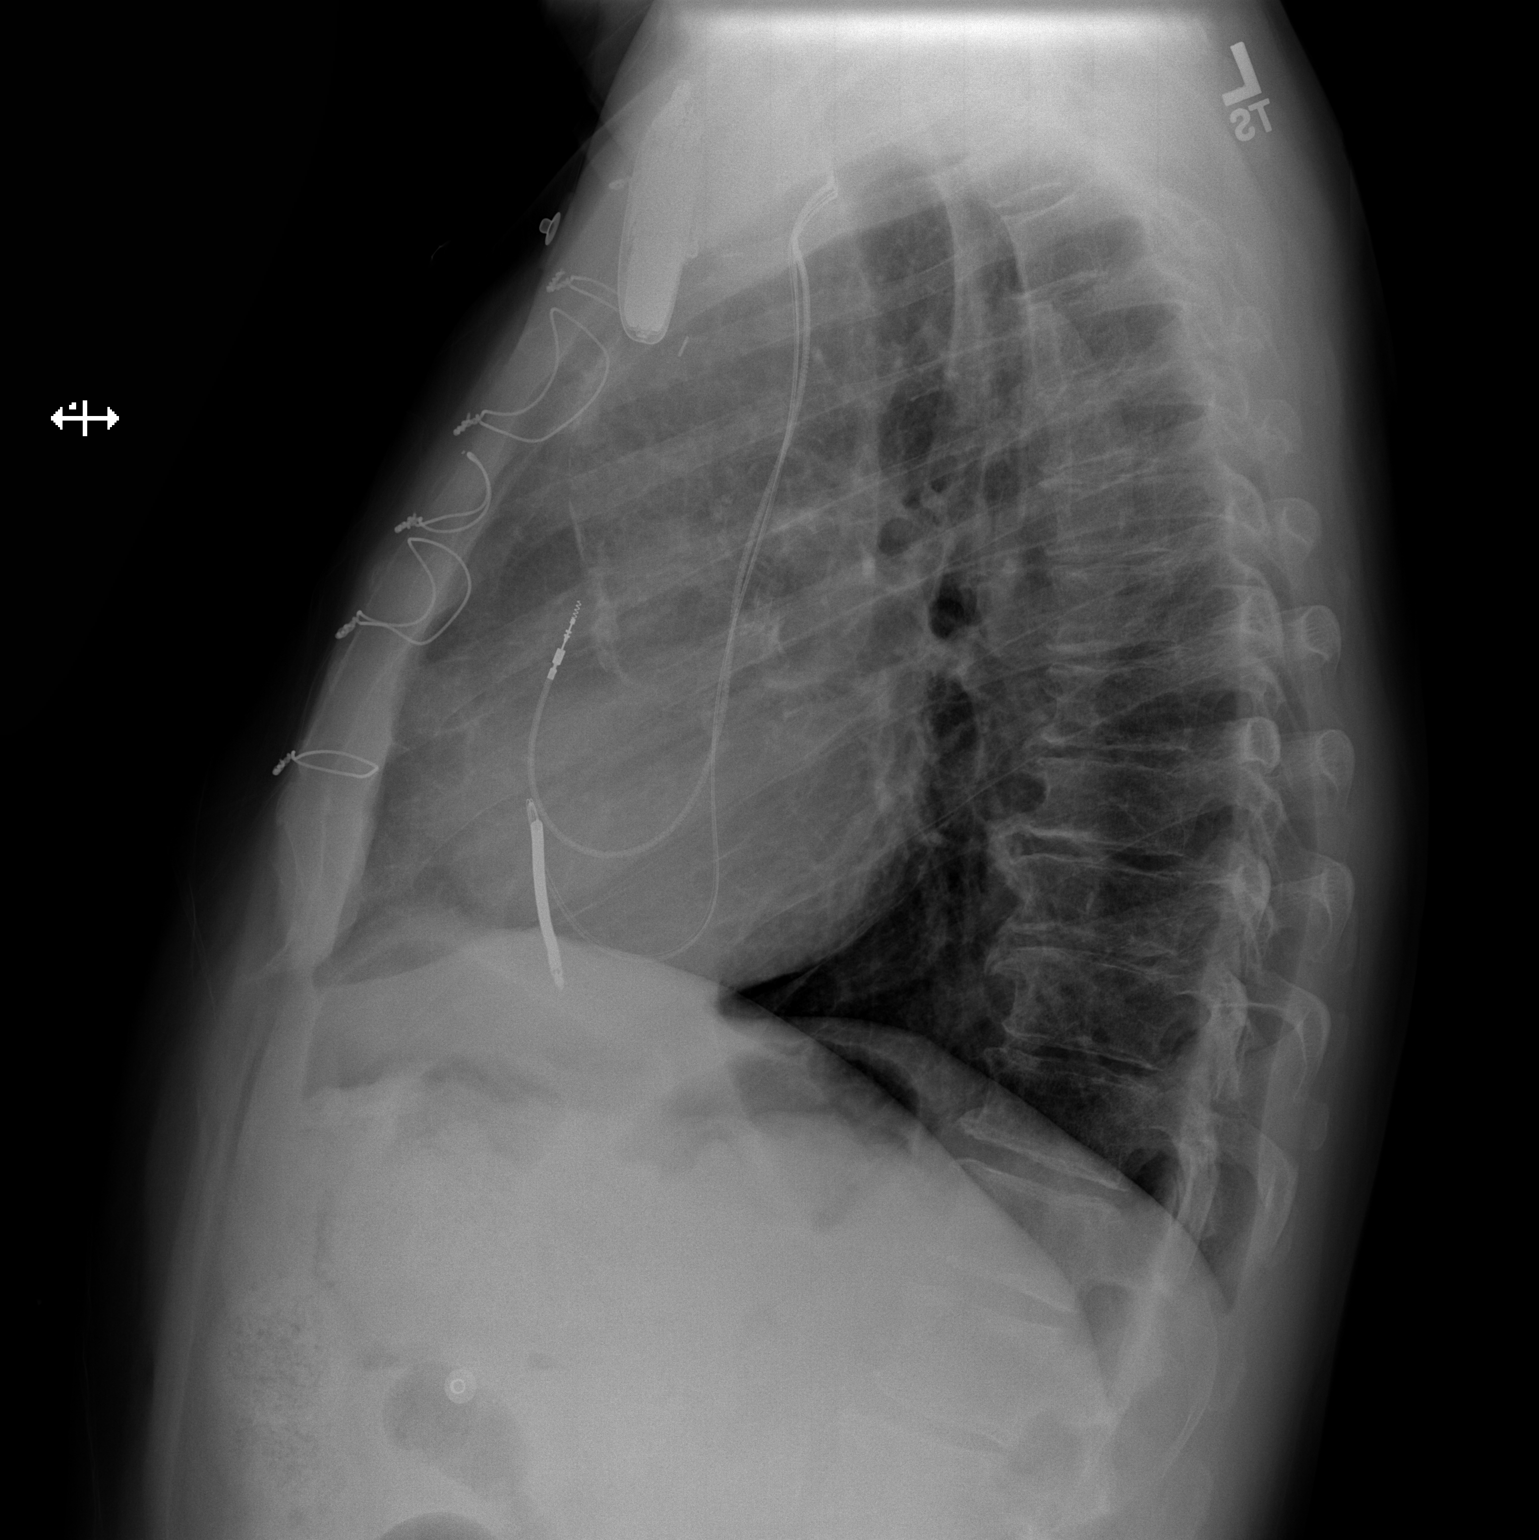

[2 of 2 positions shown; findings below may reference images not displayed]

FINDINGS: The lungs are well-aerated and clear. There is no evidence of focal
opacification, pleural effusion or pneumothorax.

The heart is normal in size; the patient is status post median
sternotomy. A pacemaker/AICD is noted at the left chest wall, with
leads ending at the right atrium and right ventricle. No acute
osseous abnormalities are seen.
IMPRESSION: No acute cardiopulmonary process seen.
